# Patient Record
Sex: Male | Born: 1995 | Race: Black or African American | Hispanic: No | Marital: Single | Smoking: Never smoker
Health system: Southern US, Community
[De-identification: ages and names within clinical notes are randomized; demographics above are authoritative.]

## PROBLEM LIST (undated history)

## (undated) DIAGNOSIS — F909 Attention-deficit hyperactivity disorder, unspecified type: Secondary | ICD-10-CM

## (undated) DIAGNOSIS — T7840XA Allergy, unspecified, initial encounter: Secondary | ICD-10-CM

## (undated) HISTORY — DX: Attention-deficit hyperactivity disorder, unspecified type: F90.9

## (undated) HISTORY — DX: Allergy, unspecified, initial encounter: T78.40XA

---
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
## 2017-03-11 DIAGNOSIS — B360 Pityriasis versicolor: Secondary | ICD-10-CM | POA: Diagnosis not present

## 2017-03-11 DIAGNOSIS — Z0000 Encounter for general adult medical examination without abnormal findings: Secondary | ICD-10-CM | POA: Diagnosis not present

## 2017-05-14 DIAGNOSIS — J209 Acute bronchitis, unspecified: Secondary | ICD-10-CM | POA: Diagnosis not present

## 2017-09-10 ENCOUNTER — Other Ambulatory Visit: Payer: Self-pay

## 2017-09-10 ENCOUNTER — Emergency Department (HOSPITAL_COMMUNITY)
Admission: EM | Admit: 2017-09-10 | Discharge: 2017-09-11 | Disposition: A | Discharge status: Home / Self Care | Payer: Commercial Managed Care - PPO | Attending: Emergency Medicine | Admitting: Emergency Medicine

## 2017-09-10 ENCOUNTER — Encounter (HOSPITAL_COMMUNITY): Payer: Self-pay | Admitting: Obstetrics and Gynecology

## 2017-09-10 DIAGNOSIS — F22 Delusional disorders: Secondary | ICD-10-CM

## 2017-09-10 DIAGNOSIS — J45909 Unspecified asthma, uncomplicated: Secondary | ICD-10-CM | POA: Diagnosis not present

## 2017-09-10 DIAGNOSIS — F19959 Other psychoactive substance use, unspecified with psychoactive substance-induced psychotic disorder, unspecified: Secondary | ICD-10-CM | POA: Diagnosis present

## 2017-09-10 DIAGNOSIS — F16950 Hallucinogen use, unspecified with hallucinogen-induced psychotic disorder with delusions: Secondary | ICD-10-CM | POA: Insufficient documentation

## 2017-09-10 DIAGNOSIS — Z79899 Other long term (current) drug therapy: Secondary | ICD-10-CM | POA: Diagnosis not present

## 2017-09-10 DIAGNOSIS — Z046 Encounter for general psychiatric examination, requested by authority: Secondary | ICD-10-CM | POA: Diagnosis not present

## 2017-09-10 DIAGNOSIS — R440 Auditory hallucinations: Secondary | ICD-10-CM | POA: Diagnosis present

## 2017-09-10 LAB — CBC
HCT: 43.1 % (ref 39.0–52.0)
Hemoglobin: 15 g/dL (ref 13.0–17.0)
MCH: 29.4 pg (ref 26.0–34.0)
MCHC: 34.8 g/dL (ref 30.0–36.0)
MCV: 84.5 fL (ref 78.0–100.0)
PLATELETS: 194 10*3/uL (ref 150–400)
RBC: 5.1 MIL/uL (ref 4.22–5.81)
RDW: 11.5 % (ref 11.5–15.5)
WBC: 3.6 10*3/uL — ABNORMAL LOW (ref 4.0–10.5)

## 2017-09-10 LAB — COMPREHENSIVE METABOLIC PANEL WITH GFR
ALT: 21 U/L (ref 0–44)
AST: 29 U/L (ref 15–41)
Albumin: 5.1 g/dL — ABNORMAL HIGH (ref 3.5–5.0)
Alkaline Phosphatase: 62 U/L (ref 38–126)
Anion gap: 15 (ref 5–15)
BUN: 18 mg/dL (ref 6–20)
CO2: 19 mmol/L — ABNORMAL LOW (ref 22–32)
Calcium: 10.3 mg/dL (ref 8.9–10.3)
Chloride: 105 mmol/L (ref 98–111)
Creatinine, Ser: 1.02 mg/dL (ref 0.61–1.24)
GFR calc Af Amer: >60 mL/min
GFR calc non Af Amer: >60 mL/min
Glucose, Bld: 91 mg/dL (ref 70–99)
Potassium: 4.1 mmol/L (ref 3.5–5.1)
Sodium: 139 mmol/L (ref 135–145)
Total Bilirubin: 1.8 mg/dL — ABNORMAL HIGH (ref 0.3–1.2)
Total Protein: 9.1 g/dL — ABNORMAL HIGH (ref 6.5–8.1)

## 2017-09-10 LAB — RAPID URINE DRUG SCREEN, HOSP PERFORMED
Amphetamines: NOT DETECTED
Barbiturates: NOT DETECTED
Benzodiazepines: NOT DETECTED
Cocaine: NOT DETECTED
Opiates: NOT DETECTED
Tetrahydrocannabinol: NOT DETECTED

## 2017-09-10 LAB — ACETAMINOPHEN LEVEL

## 2017-09-10 LAB — SALICYLATE LEVEL: Salicylate Lvl: <7 mg/dL (ref 2.8–30.0)

## 2017-09-10 LAB — ETHANOL: Alcohol, Ethyl (B): <10 mg/dL

## 2017-09-10 MED ORDER — LORAZEPAM 1 MG PO TABS
1.0000 mg | ORAL_TABLET | Freq: Once | ORAL | Status: DC
  Filled 2017-09-10: qty 1

## 2017-09-10 MED ORDER — LORAZEPAM 1 MG PO TABS: 1.0000 mg | ORAL_TABLET | ORAL | Status: DC | PRN

## 2017-09-10 NOTE — ED Triage Notes (Signed)
Pt is here with [facility] for a psych evaluation. Pt has been acting weird and staring at the walls. Pt reports he "might have been given some PCP" and reports he "read the bible and got too deep into it"

## 2017-09-10 NOTE — ED Notes (Addendum)
Patient wanded by security and belongings placed at nurses station
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
## 2017-09-10 NOTE — ED Notes (Signed)
TTS at bedside. 

## 2017-09-10 NOTE — ED Notes (Signed)
Bed: [facility]36 Expected date:  Expected time:  Means of arrival:  Comments: Hold for triage 4 

## 2017-09-10 NOTE — ED Notes (Signed)
This nurse continues to encourage pt to provide urine per MD order. Paranoia noted.

## 2017-09-10 NOTE — ED Notes (Signed)
Bed: [facility] Expected date:  Expected time:  Means of arrival:  Comments: 

## 2017-09-10 NOTE — ED Triage Notes (Addendum)
Per [employer]- Patient walked to a church and told a staff member that he needed mental help.  patient denies SI/HI, but does say he hears voices and he is on a "spiritual warfare.". Patient states he did drugs today, but does not know what he took. Patient drinking any alcohol.  [facility] notified the parents [employer] added that the patient went missing in July and was found. Today, the parents reported to [employer] that they had dropped him off at the gym today and when they went to pick him up he was missing  Again.

## 2017-09-10 NOTE — ED Provider Notes (Signed)
[facility]-EMERGENCY DEPT Provider Note   CSN: [id number] Arrival date & time: 09/10/17  1425     History   Chief Complaint Chief Complaint  Patient presents with  . Psychiatric Evaluation    HPI Cameron May is a 22 y.o. male.  Cameron May is a 22 y.o. Male history of asthma, allergies and ADHD, who presents to the ED via [employer] after patient walked into a church and told a staff member he needed mental help.  Patient states he is on an "spiritual warfare".  He hears voices that are guiding him to the path of righteousness, and he needs to rebuke all of the evil and the voices of helping him.  Patient is not able to elaborate on the specific things the voices are telling him to do.  He reports he is not currently having thoughts of harming himself or others but has had these thoughts in the past.  He reports using PCP and marijuana today but denies other drug use.  According to [employer] they notified parents who report that they dropped him off at a gym today when they went back to pick him up he was missing, they also report patient went missing in July but was found.  Parents have not yet arrived to speak with them directly.  Patient denies any medical problems, denies any focal medical complaints, no headaches, vision changes, nausea, vomiting, abdominal pain, chest pain or shortness of breath.   The history is provided by the patient and the police.    Past Medical History:  Diagnosis Date  . ADHD (attention deficit hyperactivity disorder)    No current meds  . Allergy   . Asthma    no recent Sx, has Albuterol MDI    Patient Active Problem List   Diagnosis Date Noted  . Hemorrhoid 04/21/2012  . Allergic rhinitis 04/30/2011  . ADHD (attention deficit hyperactivity disorder) 04/30/2011    History reviewed. No pertinent surgical history.      Home Medications    Prior to Admission medications   Medication Sig Start Date End Date  Taking? Authorizing Provider  albuterol (PROVENTIL HFA;VENTOLIN HFA) 108 (90 BASE) MCG/ACT inhaler Inhale 2 puffs into the lungs every 4 (four) hours as needed for wheezing or shortness of breath. 10/18/11   Preston Fleeting, MD  cetirizine (ZYRTEC) 10 MG tablet Take 10 mg by mouth daily.    [provider]  methylphenidate (CONCERTA) 27 MG CR tablet Take 1 tablet (27 mg total) by mouth daily with breakfast. 06/01/11   Lucio Edward, MD    Family History Family History  Problem Relation Age of Onset  . Cancer Paternal Grandfather 89       pacreatic cancer  . Diabetes Paternal Grandfather   . Mental retardation Sister   . Birth defects Sister        genetic abnormalities  . Hypertension Mother   . Hypertension Father 20       on medication  . Hypertension Paternal Uncle   . Hypertension Maternal Grandmother   . Hypertension Maternal Grandfather   . Kidney disease Paternal Grandmother        due hypertenstion  . Hypertension Paternal Grandmother     Social History Social History   Tobacco Use  . Smoking status: Never Smoker  . Smokeless tobacco: Never Used  Substance Use Topics  . Alcohol use: No  . Drug use: Yes    Comment: patiaent does not elaborate  Allergies   Apple   Review of Systems Review of Systems  Constitutional: Negative for chills and fever.  HENT: Negative.   Eyes: Negative for visual disturbance.  Respiratory: Negative for cough and shortness of breath.   Cardiovascular: Negative for chest pain.  Gastrointestinal: Negative for abdominal pain, nausea and vomiting.  Genitourinary: Negative for dysuria and frequency.  Musculoskeletal: Negative for arthralgias and myalgias.  Skin: Negative for color change, rash and wound.  Neurological: Negative for dizziness, syncope, light-headedness and headaches.  Psychiatric/Behavioral: Positive for hallucinations and sleep disturbance. Negative for suicidal ideas.     Physical Exam Updated Vital  Signs BP 135/75 (BP Location: Left Arm)   Pulse 76   Temp 98 F (36.7 C) (Oral)   Resp 14   Ht 5\' 11"  (1.803 m)   Wt 83.9 kg   SpO2 100%   BMI 25.80 kg/m   Physical Exam  Constitutional: He is oriented to person, place, and time. He appears well-developed and well-nourished. No distress.  HENT:  Head: Normocephalic and atraumatic.  Mouth/Throat: Oropharynx is clear and moist.  Eyes: Pupils are equal, round, and reactive to light. EOM are normal. Right eye exhibits no discharge. Left eye exhibits no discharge.  Neck: Neck supple.  Cardiovascular: Normal rate, regular rhythm, normal heart sounds and intact distal pulses.  Pulmonary/Chest: Effort normal and breath sounds normal. No respiratory distress.  Respirations equal and unlabored, patient able to speak in full sentences, lungs clear to auscultation bilaterally  Abdominal: Soft. Bowel sounds are normal. He exhibits no distension and no mass. There is no tenderness. There is no guarding.  Abdomen soft, nondistended, nontender to palpation in all quadrants without guarding or peritoneal signs  Musculoskeletal: He exhibits no edema or deformity.  Neurological: He is alert and oriented to person, place, and time. Coordination normal.  Skin: Skin is warm and dry. Capillary refill takes less than 2 seconds. He is not diaphoretic.  Psychiatric: His affect is blunt. His speech is delayed. He is slowed and withdrawn. He is not actively hallucinating. Thought content is delusional. Cognition and memory are normal. He expresses impulsivity. He expresses no homicidal and no suicidal ideation. He expresses no suicidal plans and no homicidal plans.  Nursing note and vitals reviewed.    ED Treatments / Results  Labs (all labs ordered are listed, but only abnormal results are displayed) Labs Reviewed  COMPREHENSIVE METABOLIC PANEL - Abnormal; Notable for the following components:      Result Value   CO2 19 (*)    Total Protein 9.1 (*)     Albumin 5.1 (*)    Total Bilirubin 1.8 (*)    All other components within normal limits  CBC - Abnormal; Notable for the following components:   WBC 3.6 (*)    All other components within normal limits  ACETAMINOPHEN LEVEL - Abnormal; Notable for the following components:   Acetaminophen (Tylenol), Serum <10 (*)    All other components within normal limits  ETHANOL  RAPID URINE DRUG SCREEN, HOSP PERFORMED  SALICYLATE LEVEL    EKG None  Radiology No results found.  Procedures Procedures (including critical care time)  Medications Ordered in ED Medications - No data to display   Initial Impression / Assessment and Plan / ED Course  I have reviewed the triage vital signs and the nursing notes.  Pertinent labs & imaging results that were available during my care of the patient were reviewed by me and considered in my medical decision making (see  chart for details).  Presents via [employer] requesting mental help, reports he has voices that are trying to leave him on the past 2 right business, and to help him get away from all the evil.  Patient does not appear to be actively responding to internal stimuli but does appear paranoid and delusional on exam.  Mom reports patient has not been sleeping.  He denies SI, or HI.  Vitals normal and patient without focal medical complaints.  No findings on exam.  Medical screening labs and EKG ordered.  Patient is calm and cooperative at this time continues to talk about voices leading him to a path of right dizziness.  Mom reports that patient went missing with a similar episode in July but was found, and after she dropped him off at a gym today when she went to pick him up he was nowhere to be found.  Does endorse use of PCP prior to arrival.  White count of 3.6 patient is not having any current infectious symptoms, normal hemoglobin, no acute electrolyte derangements, normal renal and liver function.  Negative ethanol, salicylate and acetaminophen  levels.  UDS is clear.  EKG without concerning changes.   At this time patient is medically cleared pending TTS evaluation, patient placed under ED psych hold.  TTS recommends overnight observation for continued patient safety, with plan reevaluation with psychiatry in the morning.  Final Clinical Impressions(s) / ED Diagnoses   Final diagnoses:  Delusions [facility])  Paranoid behavior [facility])    ED Discharge Orders    None       Dartha Lodge, [place] 09/11/17 0118    Clarene Duke Ambrose Finland, MD 09/17/17 [phone]

## 2017-09-10 NOTE — ED Notes (Signed)
Pt mother at bedside visiting with pt.  

## 2017-09-10 NOTE — ED Notes (Signed)
Patient denies pain and is resting comfortably.  

## 2017-09-10 NOTE — ED Notes (Signed)
Pt requests that he have no visitors except his mother and father. No other family is to be permitted to visit, per pt.

## 2017-09-10 NOTE — ED Notes (Signed)
Pt to room #36. Pt religiously preoccupied, deluisonal. "I've been living in my head." Denies SI/HI. Pt endorsing AH. Encouragement and support provided. Specimen cup provided and pt encouraged to provide urine per MD order. Special checks q 15 mins in place for safety, Video monitoring in place. Will continue to monitor.

## 2017-09-11 DIAGNOSIS — J45909 Unspecified asthma, uncomplicated: Secondary | ICD-10-CM | POA: Diagnosis not present

## 2017-09-11 DIAGNOSIS — F19959 Other psychoactive substance use, unspecified with psychoactive substance-induced psychotic disorder, unspecified: Secondary | ICD-10-CM

## 2017-09-11 MED ORDER — LORAZEPAM 2 MG/ML IJ SOLN
1.0000 mg | Freq: Four times a day (QID) | INTRAMUSCULAR | Status: DC | PRN
  Administered 2017-09-11: 1 mg via INTRAMUSCULAR
  Filled 2017-09-11: qty 1

## 2017-09-11 NOTE — Discharge Instructions (Signed)
For your behavioral health needs, you are advised to follow up with one of providers.  All offer both psychiatry/medication management, as well as therapy.  Contact them at your earliest opportunity to ask about scheduling an intake appointment:       [facility]      [address]      [place], [place] [address]      [phone]       [facility]      [address]      [place], [place] [address]      [phone]       [facility]      [phone]      [place], [place] [address]      [phone]

## 2017-09-11 NOTE — Consult Note (Signed)
[facility] ED Discharge  09/11/2017 12:52 PM Cameron May  MRN:  [id number] Principal Problem: Substance-induced psychotic disorder [facility]) Discharge Diagnoses:  Patient Active Problem List   Diagnosis Date Noted  . Hemorrhoid [K64.9] 04/21/2012  . Allergic rhinitis [477] 04/30/2011  . ADHD (attention deficit hyperactivity disorder) [F90.9] 04/30/2011    Subjective:  Mr. Cameron May denies SI, HI or AVH. He reports recently using PCP and reports AVH while intoxicated. He has been experimenting with multiple drugs including Xanax, LSD and Percocet. He regularly uses marijuana. He reports self-medicating with drugs due to depression. He lives at home with his parents and sister and feels safe for discharge.   Total Time spent with patient: 30 minutes  Past Psychiatric History: Depression  Past Medical History:  Past Medical History:  Diagnosis Date  . ADHD (attention deficit hyperactivity disorder)    No current meds  . Allergy   . Asthma    no recent Sx, has Albuterol MDI   History reviewed. No pertinent surgical history. Family History:  Family History  Problem Relation Age of Onset  . Cancer Paternal Grandfather 27       pacreatic cancer  . Diabetes Paternal Grandfather   . Mental retardation Sister   . Birth defects Sister        genetic abnormalities  . Hypertension Mother   . Hypertension Father 73       on medication  . Hypertension Paternal Uncle   . Hypertension Maternal Grandmother   . Hypertension Maternal Grandfather   . Kidney disease Paternal Grandmother        due hypertenstion  . Hypertension Paternal Grandmother    Family Psychiatric  History: Sister-IDD Social History:  Social History   Substance and Sexual Activity  Alcohol Use No    Social History   Substance and Sexual Activity  Drug Use Yes   Comment: patiaent does not elaborate   Social History   Socioeconomic History  . Marital status: Single    Spouse name: Not on file  . Number  of children: Not on file  . Years of education: Not on file  . Highest education level: Not on file  Occupational History  . Not on file  Social Needs  . Financial resource strain: Not on file  . Food insecurity:    Worry: Not on file    Inability: Not on file  . Transportation needs:    Medical: Not on file    Non-medical: Not on file  Tobacco Use  . Smoking status: Never Smoker  . Smokeless tobacco: Never Used  Substance and Sexual Activity  . Alcohol use: No  . Drug use: Yes    Comment: patiaent does not elaborate  . Sexual activity: Never  Lifestyle  . Physical activity:    Days per week: Not on file    Minutes per session: Not on file  . Stress: Not on file  Relationships  . Social connections:    Talks on phone: Not on file    Gets together: Not on file    Attends religious service: Not on file    Active member of club or organization: Not on file    Attends meetings of clubs or organizations: Not on file    Relationship status: Not on file  Other Topics Concern  . Not on file  Social History Narrative   [employer]   9 th grade.    Has this patient used any form of tobacco  in the last 30 days? (Cigarettes, Smokeless Tobacco, Cigars, and/or Pipes) Prescription not provided because: N/A  Current Medications: Current Facility-Administered Medications  Medication Dose Route Frequency Provider Last Rate Last Dose  . LORazepam (ATIVAN) injection 1 mg  1 mg Intramuscular Q6H PRN Dartha LodgeFord, Kelsey N, PA-C   1 mg at 09/11/17 0023   No current outpatient medications on file.   PTA Medications:  (Not in a hospital admission)  Musculoskeletal: Strength & Muscle Tone: within normal limits Gait & Station: UTA since patient is lying in bed. Patient leans: N/A  Psychiatric Specialty Exam: Physical Exam  Nursing note and vitals reviewed. Constitutional: He is oriented to person, place, and time. He appears well-developed and well-nourished.  HENT:   Head: Normocephalic and atraumatic.  Neck: Normal range of motion.  Respiratory: Effort normal.  Musculoskeletal: Normal range of motion.  Neurological: He is alert and oriented to person, place, and time.  Skin: No rash noted.  Psychiatric: He has a normal mood and affect. His speech is normal and behavior is normal. Judgment and thought content normal. Cognition and memory are normal.    Review of Systems  Psychiatric/Behavioral: Positive for depression and substance abuse. Negative for hallucinations and suicidal ideas.  All other systems reviewed and are negative.   Blood pressure 118/66, pulse 95, temperature 98.5 F (36.9 C), temperature source Oral, resp. rate 16, height 5\' 11"  (1.803 m), weight 83.9 kg, SpO2 95 %.Body mass index is 25.8 kg/m.  General Appearance: Fairly Groomed, young, African American male, wearing paper hospital scrubs and lying in bed. NAD.   Eye Contact:  Good  Speech:  Clear and Coherent and Normal Rate  Volume:  Normal  Mood:  Depressed  Affect:  Constricted  Thought Process:  Goal Directed, Linear and Descriptions of Associations: Intact  Orientation:  Full (Time, Place, and Person)  Thought Content:  Logical  Suicidal Thoughts:  No  Homicidal Thoughts:  No  Memory:  Immediate;   Good Recent;   Good Remote;   Good  Judgement:  Fair  Insight:  Fair  Psychomotor Activity:  Normal  Concentration:  Concentration: Good and Attention Span: Good  Recall:  Good  Fund of Knowledge:  Good  Language:  Good  Akathisia:  No  Handed:  Right  AIMS (if indicated):   N/A  Assets:  Communication Skills Desire for Improvement Housing Physical Health Social Support  ADL's:  Intact  Cognition:  WNL  Sleep:   N/A   Assessment:  Cameron May is a 22 y.o. male presented to the hospital with psychosis in the setting of PCP use. He is clear and well organized in thought process today. He minimizes his substance use although he was counseled about  cessation. He denies SI, HI or AVH and does not appear to be responding to internal stimuli. He does not warrant inpatient psychiatric hospitalization at this time.   Demographic Factors:  Male and Adolescent or young adult  Loss Factors: NA  Historical Factors: Impulsivity  Risk Reduction Factors:   Positive social support  Continued Clinical Symptoms:  Depression:   Impulsivity  Cognitive Features That Contribute To Risk:  None    Suicide Risk:  Minimal: No identifiable suicidal ideation.  Patients presenting with no risk factors but with morbid ruminations; may be classified as minimal risk based on the severity of the depressive symptoms    Plan Of Care/Follow-up recommendations:  -Patient will be provided with outpatient resources.  -Educated about substance abuse cessation.  Disposition: Discharge home.  Cherly Beach, DO 09/11/2017, 12:52 PM

## 2017-09-11 NOTE — ED Notes (Addendum)
Dr Sharma CovertNorman and Feliz Beamravis NP into see.  Pt sleeping, arousable.  Pt reports that he did PCP yesterday and denies current si/hi/avh at this time.  Pt reports that he does not do drugs a lot, but does use them when he gets depressed.  Pt reports that he lives with his family.  Pt reports he is willing to go to OP therapy and is also interested in councling at his church.

## 2017-09-11 NOTE — BH Assessment (Signed)
[facility]  Per Juanetta BeetsJacqueline Norman, DO, this pt does not require psychiatric hospitalization at this time.  [facility] information for area psychiatrists and therapists for follow up on his own initiative.  This has been included in pt's discharge instructions.  Pt's nurse, Wille CelesteJanie, has been notified.  Doylene Canninghomas Emrys Mckamie, MA Triage Specialist [phone]

## 2017-09-11 NOTE — ED Notes (Signed)
Did not awaken for vitals  

## 2017-09-11 NOTE — ED Notes (Signed)
Pt's mother called and she will pick him up

## 2017-09-11 NOTE — ED Notes (Addendum)
Administered meds Ativan IM Rt. Deltoid @ 0023. Pt slept about 5 hours, Sleeping hours 0145- continues to sleep.

## 2017-09-11 NOTE — ED Notes (Signed)
Written dc instructions reviewed with pt.  Pt encouraged to follow up with OP referrals as soon as possible, pt verbalized understanding.  Pt's mother has brought clothes for him to change into.  Pt ambulatory with out difficulty to dc area, belongings returned after leaving the area.  Pt's parents are here to pick him up.

## 2017-11-20 ENCOUNTER — Ambulatory Visit (HOSPITAL_COMMUNITY)
Admission: RE | Admit: 2017-11-20 | Discharge: 2017-11-20 | Disposition: A | Discharge status: Home / Self Care | Payer: Commercial Managed Care - PPO | Source: Home / Self Care | Attending: Psychiatry | Admitting: Psychiatry

## 2017-11-20 DIAGNOSIS — Z91018 Allergy to other foods: Secondary | ICD-10-CM

## 2017-11-20 DIAGNOSIS — R443 Hallucinations, unspecified: Secondary | ICD-10-CM | POA: Diagnosis not present

## 2017-11-20 DIAGNOSIS — D72819 Decreased white blood cell count, unspecified: Secondary | ICD-10-CM | POA: Insufficient documentation

## 2017-11-20 DIAGNOSIS — J45909 Unspecified asthma, uncomplicated: Secondary | ICD-10-CM | POA: Insufficient documentation

## 2017-11-20 DIAGNOSIS — Z8249 Family history of ischemic heart disease and other diseases of the circulatory system: Secondary | ICD-10-CM | POA: Insufficient documentation

## 2017-11-20 DIAGNOSIS — F28 Other psychotic disorder not due to a substance or known physiological condition: Secondary | ICD-10-CM | POA: Diagnosis not present

## 2017-11-20 DIAGNOSIS — F909 Attention-deficit hyperactivity disorder, unspecified type: Secondary | ICD-10-CM | POA: Insufficient documentation

## 2017-11-20 DIAGNOSIS — F3181 Bipolar II disorder: Secondary | ICD-10-CM

## 2017-11-20 DIAGNOSIS — F99 Mental disorder, not otherwise specified: Secondary | ICD-10-CM | POA: Diagnosis present
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
## 2017-11-21 ENCOUNTER — Encounter (HOSPITAL_COMMUNITY): Payer: Self-pay | Admitting: *Deleted

## 2017-11-21 ENCOUNTER — Emergency Department (HOSPITAL_COMMUNITY)
Admission: EM | Admit: 2017-11-21 | Discharge: 2017-11-21 | Disposition: A | Discharge status: Other Acute Inpatient Hospital | Payer: Commercial Managed Care - PPO | Attending: Emergency Medicine | Admitting: Emergency Medicine

## 2017-11-21 ENCOUNTER — Inpatient Hospital Stay (HOSPITAL_COMMUNITY)
Admission: AD | Admit: 2017-11-21 | Discharge: 2017-11-25 | DRG: 885 | Disposition: A | Discharge status: Home / Self Care | Payer: Commercial Managed Care - PPO | Source: Intra-hospital | Attending: Psychiatry | Admitting: Psychiatry

## 2017-11-21 ENCOUNTER — Other Ambulatory Visit: Payer: Self-pay

## 2017-11-21 DIAGNOSIS — F1190 Opioid use, unspecified, uncomplicated: Secondary | ICD-10-CM | POA: Diagnosis present

## 2017-11-21 DIAGNOSIS — F29 Unspecified psychosis not due to a substance or known physiological condition: Secondary | ICD-10-CM | POA: Diagnosis present

## 2017-11-21 DIAGNOSIS — Z7289 Other problems related to lifestyle: Secondary | ICD-10-CM | POA: Diagnosis not present

## 2017-11-21 DIAGNOSIS — F1290 Cannabis use, unspecified, uncomplicated: Secondary | ICD-10-CM | POA: Diagnosis present

## 2017-11-21 DIAGNOSIS — F909 Attention-deficit hyperactivity disorder, unspecified type: Secondary | ICD-10-CM | POA: Diagnosis present

## 2017-11-21 DIAGNOSIS — J309 Allergic rhinitis, unspecified: Secondary | ICD-10-CM | POA: Diagnosis present

## 2017-11-21 DIAGNOSIS — F23 Brief psychotic disorder: Principal | ICD-10-CM | POA: Diagnosis present

## 2017-11-21 DIAGNOSIS — R45850 Homicidal ideations: Secondary | ICD-10-CM | POA: Diagnosis present

## 2017-11-21 DIAGNOSIS — Z62810 Personal history of physical and sexual abuse in childhood: Secondary | ICD-10-CM | POA: Diagnosis present

## 2017-11-21 DIAGNOSIS — D72819 Decreased white blood cell count, unspecified: Secondary | ICD-10-CM

## 2017-11-21 DIAGNOSIS — Z91018 Allergy to other foods: Secondary | ICD-10-CM | POA: Diagnosis not present

## 2017-11-21 DIAGNOSIS — J45909 Unspecified asthma, uncomplicated: Secondary | ICD-10-CM | POA: Diagnosis not present

## 2017-11-21 DIAGNOSIS — F323 Major depressive disorder, single episode, severe with psychotic features: Secondary | ICD-10-CM | POA: Diagnosis present

## 2017-11-21 DIAGNOSIS — R45851 Suicidal ideations: Secondary | ICD-10-CM | POA: Diagnosis present

## 2017-11-21 DIAGNOSIS — F3181 Bipolar II disorder: Secondary | ICD-10-CM

## 2017-11-21 DIAGNOSIS — Z915 Personal history of self-harm: Secondary | ICD-10-CM | POA: Diagnosis not present

## 2017-11-21 DIAGNOSIS — Z810 Family history of intellectual disabilities: Secondary | ICD-10-CM

## 2017-11-21 DIAGNOSIS — F419 Anxiety disorder, unspecified: Secondary | ICD-10-CM | POA: Diagnosis not present

## 2017-11-21 DIAGNOSIS — Z8249 Family history of ischemic heart disease and other diseases of the circulatory system: Secondary | ICD-10-CM | POA: Diagnosis not present

## 2017-11-21 DIAGNOSIS — G4700 Insomnia, unspecified: Secondary | ICD-10-CM | POA: Diagnosis not present

## 2017-11-21 DIAGNOSIS — F28 Other psychotic disorder not due to a substance or known physiological condition: Secondary | ICD-10-CM | POA: Diagnosis not present

## 2017-11-21 DIAGNOSIS — F19959 Other psychoactive substance use, unspecified with psychoactive substance-induced psychotic disorder, unspecified: Secondary | ICD-10-CM | POA: Diagnosis present

## 2017-11-21 LAB — CBC
HCT: 42.2 % (ref 39.0–52.0)
Hemoglobin: 14.3 g/dL (ref 13.0–17.0)
MCH: 29.9 pg (ref 26.0–34.0)
MCHC: 33.9 g/dL (ref 30.0–36.0)
MCV: 88.1 fL (ref 80.0–100.0)
Platelets: 190 10*3/uL (ref 150–400)
RBC: 4.79 MIL/uL (ref 4.22–5.81)
RDW: 11.4 % — AB (ref 11.5–15.5)
WBC: 2.6 10*3/uL — ABNORMAL LOW (ref 4.0–10.5)
nRBC: 0 % (ref 0.0–0.2)

## 2017-11-21 LAB — COMPREHENSIVE METABOLIC PANEL
ALBUMIN: 4.7 g/dL (ref 3.5–5.0)
ALT: 18 U/L (ref 0–44)
AST: 23 U/L (ref 15–41)
Alkaline Phosphatase: 58 U/L (ref 38–126)
Anion gap: 9 (ref 5–15)
BILIRUBIN TOTAL: 1.3 mg/dL — AB (ref 0.3–1.2)
BUN: 15 mg/dL (ref 6–20)
CHLORIDE: 104 mmol/L (ref 98–111)
CO2: 26 mmol/L (ref 22–32)
Calcium: 9.6 mg/dL (ref 8.9–10.3)
Creatinine, Ser: 0.98 mg/dL (ref 0.61–1.24)
GFR calc Af Amer: >60 mL/min (ref >60)
GFR calc non Af Amer: >60 mL/min (ref >60)
GLUCOSE: 89 mg/dL (ref 70–99)
POTASSIUM: 3.7 mmol/L (ref 3.5–5.1)
SODIUM: 139 mmol/L (ref 135–145)
Total Protein: 8.5 g/dL — ABNORMAL HIGH (ref 6.5–8.1)

## 2017-11-21 LAB — RAPID URINE DRUG SCREEN, HOSP PERFORMED
Amphetamines: NOT DETECTED
Barbiturates: NOT DETECTED
Benzodiazepines: NOT DETECTED
Cocaine: NOT DETECTED
Opiates: NOT DETECTED
Tetrahydrocannabinol: NOT DETECTED

## 2017-11-21 LAB — ETHANOL: Alcohol, Ethyl (B): <10 mg/dL (ref <10)

## 2017-11-21 MED ORDER — MAGNESIUM HYDROXIDE 400 MG/5ML PO SUSP: 30.0000 mL | Freq: Every day | ORAL | Status: DC | PRN

## 2017-11-21 MED ORDER — HYDROXYZINE HCL 25 MG PO TABS: 25.0000 mg | ORAL_TABLET | Freq: Three times a day (TID) | ORAL | Status: DC | PRN

## 2017-11-21 MED ORDER — ACETAMINOPHEN 325 MG PO TABS: 650.0000 mg | ORAL_TABLET | Freq: Four times a day (QID) | ORAL | Status: DC | PRN

## 2017-11-21 MED ORDER — TRAZODONE HCL 50 MG PO TABS: 50.0000 mg | ORAL_TABLET | Freq: Every day | ORAL | Status: DC

## 2017-11-21 MED ORDER — ALUM & MAG HYDROXIDE-SIMETH 200-200-20 MG/5ML PO SUSP: 30.0000 mL | ORAL | Status: DC | PRN

## 2017-11-21 MED ORDER — HYDROXYZINE HCL 25 MG PO TABS
25.0000 mg | ORAL_TABLET | Freq: Three times a day (TID) | ORAL | Status: DC | PRN
  Filled 2017-11-21: qty 1

## 2017-11-21 MED ORDER — TRAZODONE HCL 50 MG PO TABS
50.0000 mg | ORAL_TABLET | Freq: Every day | ORAL | Status: DC
  Filled 2017-11-21 (×7): qty 1

## 2017-11-21 NOTE — ED Notes (Signed)
Pt in bed resting with eyes closed.

## 2017-11-21 NOTE — ED Notes (Signed)
Bed: ION62 Expected date:  Expected time:  Means of arrival:  Comments: Hold for triage bed 4

## 2017-11-21 NOTE — ED Notes (Signed)
Pt alert and oriented, pt denies any pain or discomfort at this time. Pt denies si,hi,and avh. Pt resting quietly in bed, will continue to monitor.

## 2017-11-21 NOTE — ED Notes (Signed)
Pt cannot give urine specimen at this time.

## 2017-11-21 NOTE — H&P (Signed)
Behavioral Health Medical Screening Exam  Cameron May is an 22 y.o. male presented to [facility] as a walk-in voluntarily, accompanied by his father, Cameron May and a mobile crisis clinician [name] ([facility]). Per report, pt made comments to his mother about getting a gun to kill himself. He is demonstrating marked response to internal stimuli and reported VH  Total Time spent with patient: 20 minutes  Psychiatric Specialty Exam: Physical Exam  Constitutional: He is oriented to person, place, and time. He appears well-developed and well-nourished. No distress.  HENT:  Head: Normocephalic.  Eyes: Pupils are equal, round, and reactive to light.  Respiratory: Effort normal and breath sounds normal.  Neurological: He is alert and oriented to person, place, and time. No cranial nerve deficit.  Skin: Skin is warm and dry. He is not diaphoretic.  Psychiatric: His mood appears anxious. His speech is delayed. He is withdrawn and actively hallucinating. Cognition and memory are impaired. He expresses inappropriate judgment. He exhibits a depressed mood. He expresses suicidal ideation. He expresses suicidal plans.    Review of Systems  Constitutional: Negative for chills, diaphoresis, fever, malaise/fatigue and weight loss.  Psychiatric/Behavioral: Positive for depression, hallucinations and suicidal ideas. Negative for memory loss and substance abuse. The patient is nervous/anxious and has insomnia.     There were no vitals taken for this visit.There is no height or weight on file to calculate BMI.  General Appearance: Casual  Eye Contact:  Fair  Speech:  Blocked  Volume:  Normal  Mood:  Depressed  Affect:  Congruent  Thought Process:  Disorganized  Orientation:  Full (Time, Place, and Person)  Thought Content:  Illogical  Suicidal Thoughts:  Yes.  with intent/plan  Homicidal Thoughts:  No  Memory:  Immediate;   Poor  Judgement:  Impaired  Insight:  Lacking  Psychomotor  Activity:  Normal  Concentration: Concentration: Poor  Recall:  Poor  Fund of Knowledge:Fair  Language: Fair  Akathisia:  Negative  Handed:  Right  AIMS (if indicated):     Assets:  Desire for Improvement  Sleep:       Musculoskeletal: Strength & Muscle Tone: within normal limits Gait & Station: normal Patient leans: N/A  There were no vitals taken for this visit.  Recommendations:  Based on my evaluation the patient does not appear to have an emergency medical condition.  Kerry Hough, PA-C 11/21/2017, 5:51 AM

## 2017-11-21 NOTE — BH Assessment (Addendum)
[facility] Assessment Progress Note  Per Juanetta Beets, DO, this pt requires psychiatric hospitalization at this time.  Berneice Heinrich, RN, [facility] has assigned pt to [facility] Rm [id number]; [facility] will be ready to receive pt at 20:00.  Pt has signed Voluntary Admission and Consent for Treatment, as well as Consent to Release Information to pt's mother, and signed forms have been faxed to [facility].  Pt's nurse, Diane, has been notified, and agrees to send original paperwork along with pt via Juel Burrow, and to call report to [phone].  Doylene Canning, [place] Behavioral Health Coordinator [phone]   Addendum:  Per Berneice Heinrich, [facility] is now ready to receive pt at 17:00.  Diane has been notified.  Doylene Canning, [place] Behavioral Health Coordinator [phone]

## 2017-11-21 NOTE — Progress Notes (Signed)
D: Pt denies SI/HI/AVH- "not anymore". Pt is pleasant and cooperative. Pt visible on the unit, pt presents very childlike with his mindset about life and situations.  A: Pt was offered support and encouragement.  Pt was encourage to attend groups. Q 15 minute checks were done for safety.  R:Pt attends groups and interacts well with peers and staff.  Pt has no complaints.Pt receptive to treatment and safety maintained on unit.  Problem: Activity: Goal: Interest or engagement in activities will improve Outcome: Progressing   Problem: Activity: Goal: Sleeping patterns will improve Outcome: Progressing   Problem: Safety: Goal: Periods of time without injury will increase Outcome: Progressing

## 2017-11-21 NOTE — ED Notes (Signed)
Report given to Alyssa at [facility] [place] transportation called for transport.

## 2017-11-21 NOTE — ED Notes (Signed)
Transported to [facility] by [employer]. All belongings returned to pt who signed for same. PT was calm and cooperative.

## 2017-11-21 NOTE — ED Triage Notes (Signed)
Pt reports he is here because he needs to find himself and thinks that he is running out of time.  Sometimes he thinks that he does not need to be here.  He denies hallucinations but states "talking like rapping, feel me?  It's bad and cool at the same time, feel me?"  He denies SI/HI.  Pt mumbles when talking.  He denies any hx.
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
## 2017-11-21 NOTE — ED Notes (Signed)
Pt taken to BR to attempt to provide urine sample, pt stated he was unable to provide sample.  Pt standing in front of mirror in BR and talking to himself. Pt stated, "I need to talk to the man in the mirror, ya feel me".

## 2017-11-21 NOTE — ED Notes (Signed)
Pt appears to be disorganized some as he almost went into another person's room. He is redirectable and calm and cooperative. Used the emergency alarm in his room thinking it is a call bell and said, "I am the king and do not feel like exerting myself."

## 2017-11-21 NOTE — ED Provider Notes (Signed)
[facility]-EMERGENCY DEPT Provider Note   CSN: [id number] Arrival date & time: 11/20/17  2329     History   Chief Complaint Chief Complaint  Patient presents with  . Medical Clearance    HPI Nazier Neyhart is a 22 y.o. male.   22 year old male with a history of ADHD and substance-induced psychotic disorder presents to the emergency department for evaluation.  Patient in triage reports that he "needs to find himself and thinks he is running out of time".  Denies any alcohol or illicit drug use today.  Also denies SI and HI.  Has been exhibiting bizarre behavior and triage intermittently.  Resting comfortably during my assessment with no acute complaints.  Denies any physical pain.  States that he is not presently on any medications.  Further denies being followed by a primary care doctor or psychiatric provider.  The history is provided by the patient. No language interpreter was used.    Past Medical History:  Diagnosis Date  . ADHD (attention deficit hyperactivity disorder)    No current meds  . Allergy   . Asthma    no recent Sx, has Albuterol MDI    Patient Active Problem List   Diagnosis Date Noted  . Substance-induced psychotic disorder (HCC)   . Hemorrhoid 04/21/2012  . Allergic rhinitis 04/30/2011  . ADHD (attention deficit hyperactivity disorder) 04/30/2011    History reviewed. No pertinent surgical history.      Home Medications    Prior to Admission medications   Not on File    Family History Family History  Problem Relation Age of Onset  . Cancer Paternal Grandfather 66       pacreatic cancer  . Diabetes Paternal Grandfather   . Mental retardation Sister   . Birth defects Sister        genetic abnormalities  . Hypertension Mother   . Hypertension Father 48       on medication  . Hypertension Paternal Uncle   . Hypertension Maternal Grandmother   . Hypertension Maternal Grandfather   . Kidney disease Paternal  Grandmother        due hypertenstion  . Hypertension Paternal Grandmother     Social History Social History   Tobacco Use  . Smoking status: Never Smoker  . Smokeless tobacco: Never Used  Substance Use Topics  . Alcohol use: No  . Drug use: Yes    Comment: patiaent does not elaborate     Allergies   Apple   Review of Systems Review of Systems Ten systems reviewed and are negative for acute change, except as noted in the HPI.    Physical Exam Updated Vital Signs BP 117/66 (BP Location: Left Arm)   Pulse (!) 56   Temp 97.6 F (36.4 C) (Oral)   Resp 14   Ht 5\' 10"  (1.778 m)   Wt 72.6 kg   SpO2 100%   BMI 22.96 kg/m   Physical Exam  Constitutional: He is oriented to person, place, and time. He appears well-developed and well-nourished. No distress.  Calm and cooperative, in NAD  HENT:  Head: Normocephalic and atraumatic.  Eyes: Conjunctivae and EOM are normal. No scleral icterus.  Neck: Normal range of motion.  Pulmonary/Chest: Effort normal. No respiratory distress.  Respirations even and unlabored  Musculoskeletal: Normal range of motion.  Neurological: He is alert and oriented to person, place, and time.  Skin: Skin is warm and dry. No rash noted. He is not diaphoretic. No erythema.  No pallor.  Psychiatric: He has a normal mood and affect. His behavior is normal.  Denies SI/HI  Nursing note and vitals reviewed.    ED Treatments / Results  Labs (all labs ordered are listed, but only abnormal results are displayed) Labs Reviewed  COMPREHENSIVE METABOLIC PANEL - Abnormal; Notable for the following components:      Result Value   Total Protein 8.5 (*)    Total Bilirubin 1.3 (*)    All other components within normal limits  CBC - Abnormal; Notable for the following components:   WBC 2.6 (*)    RDW 11.4 (*)    All other components within normal limits  ETHANOL  RAPID URINE DRUG SCREEN, HOSP PERFORMED  PATHOLOGIST SMEAR REVIEW     EKG None  Radiology No results found.  Procedures Procedures (including critical care time)  Medications Ordered in ED Medications - No data to display   Initial Impression / Assessment and Plan / ED Course  I have reviewed the triage vital signs and the nursing notes.  Pertinent labs & imaging results that were available during my care of the patient were reviewed by me and considered in my medical decision making (see chart for details).     22 year old male presents to the emergency department for medical clearance.  Was evaluated earlier tonight at [facility].  Noted to have leukopenia similar to 2 months prior.  Otherwise, medically cleared.  TTS recommending inpatient placement for management of bipolar 2 disorder.  Disposition to be determined by oncoming ED provider.   Final Clinical Impressions(s) / ED Diagnoses   Final diagnoses:  Bipolar II disorder (HCC)  Leukopenia, unspecified type    ED Discharge Orders    None       Antony Madura, PA-C 11/21/17 0449    Paula Libra, MD 11/21/17 [phone]

## 2017-11-21 NOTE — Consult Note (Addendum)
[place] Psychiatry Consult   Reason for Consult:  Bizarre behavior Referring Physician:  EDP Patient Identification: Cameron May MRN:  [id number] Principal Diagnosis: Psychosis [facility] Diagnosis:   Patient Active Problem List   Diagnosis Date Noted  . Substance-induced psychotic disorder ([place]) [F19.959]   . Hemorrhoid [K64.9] 04/21/2012  . Allergic rhinitis [477] 04/30/2011  . ADHD (attention deficit hyperactivity disorder) [F90.9] 04/30/2011    Total Time spent with patient: 30 minutes  Subjective:   Cameron May is a 22 y.o. male patient admitted with bizarre behavior.  HPI:  Pt was seen and chart reviewed with treatment team and Dr Mariea Clonts. Pt denies suicidal/homicidal ideation, endorses auditory/visual hallucinations and does not appears to be responding to internal stimuli. Pt stated he feels like he is in a "test tube" and that "there is a truth out there that he doesn't know." Pt is disorganized  states he hears voices that say different things to him and that the conversations pop up in his mind and ha can see them. Pt stated he does not take any mental health medications but then says he sees a psychiatrist named Santiago Glad but she is just who she is "caring." Pt was last seen in the [facility] in Aug 2019 and at that time stated he experimented with PCP and other illicit drugs that [facility] does not test for. He was found not to be a threat to himself or others and discharged. Pt today stated he last smoked marijuana recently to see if it would help him. Pt's UDS and BAL are negative for substances tested for at this facility. Pt lives with his parents and a sister and stated he works at [employer] and is enrolled in college but is not currently attending classes. Pt is malodorous and it is unclear if he is actually taking care of his hygiene needs at home. Pt would benefit from an inpatient psychiatric admission for medication management and crisis stabilization.   Past  Psychiatric History: As above  Risk to Self:  Yes. He endorses SI.  Risk to Others:  Yes. He reports CAH to harm others.  Prior Inpatient Therapy:  Denies  Prior Outpatient Therapy:   He sees a provider named Santiago Glad (possibly a therapist since he does not receive medications).   Past Medical History:  Past Medical History:  Diagnosis Date  . ADHD (attention deficit hyperactivity disorder)    No current meds  . Allergy   . Asthma    no recent Sx, has Albuterol MDI   History reviewed. No pertinent surgical history. Family History:  Family History  Problem Relation Age of Onset  . Cancer Paternal Grandfather 66       pacreatic cancer  . Diabetes Paternal Grandfather   . Mental retardation Sister   . Birth defects Sister        genetic abnormalities  . Hypertension Mother   . Hypertension Father 5       on medication  . Hypertension Paternal Uncle   . Hypertension Maternal Grandmother   . Hypertension Maternal Grandfather   . Kidney disease Paternal Grandmother        due hypertenstion  . Hypertension Paternal Grandmother    Family Psychiatric  History: Denies   Social History:  Social History   Substance and Sexual Activity  Alcohol Use No     Social History   Substance and Sexual Activity  Drug Use Yes   Comment: patiaent does not elaborate    Social History  Socioeconomic History  . Marital status: Single    Spouse name: Not on file  . Number of children: Not on file  . Years of education: Not on file  . Highest education level: Not on file  Occupational History  . Not on file  Social Needs  . Financial resource strain: Not on file  . Food insecurity:    Worry: Not on file    Inability: Not on file  . Transportation needs:    Medical: Not on file    Non-medical: Not on file  Tobacco Use  . Smoking status: Never Smoker  . Smokeless tobacco: Never Used  Substance and Sexual Activity  . Alcohol use: No  . Drug use: Yes    Comment: patiaent does  not elaborate  . Sexual activity: Never  Lifestyle  . Physical activity:    Days per week: Not on file    Minutes per session: Not on file  . Stress: Not on file  Relationships  . Social connections:    Talks on phone: Not on file    Gets together: Not on file    Attends religious service: Not on file    Active member of club or organization: Not on file    Attends meetings of clubs or organizations: Not on file    Relationship status: Not on file  Other Topics Concern  . Not on file  Social History Narrative   [employer]   9 th grade.   Additional Social History: N/A    Allergies:   Allergies  Allergen Reactions  . Apple Itching    Labs:  Results for orders placed or performed during the hospital encounter of 11/21/17 (from the past 48 hour(s))  Comprehensive metabolic panel     Status: Abnormal   Collection Time: 11/21/17 12:31 AM  Result Value Ref Range   Sodium 139 135 - 145 mmol/L   Potassium 3.7 3.5 - 5.1 mmol/L   Chloride 104 98 - 111 mmol/L   CO2 26 22 - 32 mmol/L   Glucose, Bld 89 70 - 99 mg/dL   BUN 15 6 - 20 mg/dL   Creatinine, Ser 0.98 0.61 - 1.24 mg/dL   Calcium 9.6 8.9 - 10.3 mg/dL   Total Protein 8.5 (H) 6.5 - 8.1 g/dL   Albumin 4.7 3.5 - 5.0 g/dL   AST 23 15 - 41 U/L   ALT 18 0 - 44 U/L   Alkaline Phosphatase 58 38 - 126 U/L   Total Bilirubin 1.3 (H) 0.3 - 1.2 mg/dL   GFR calc non Af Amer >60 >60 mL/min   GFR calc Af Amer >60 >60 mL/min    Comment: (NOTE) The eGFR has been calculated using the CKD EPI equation. This calculation has not been validated in all clinical situations. eGFR's persistently <60 mL/min signify possible Chronic Kidney Disease.    Anion gap 9 5 - 15    Comment: Performed at [facility], [place] [address] [place], [place] [address]  Ethanol     Status: None   Collection Time: 11/21/17 12:31 AM  Result Value Ref Range   Alcohol, Ethyl (B) <10 <10 mg/dL    Comment: (NOTE) Lowest  detectable limit for serum alcohol is 10 mg/dL. For medical purposes only. Performed at [facility], [place] [address] [place], [place] [address]   cbc     Status: Abnormal   Collection Time: 11/21/17 12:31 AM  Result Value Ref Range  WBC 2.6 (L) 4.0 - 10.5 K/uL   RBC 4.79 4.22 - 5.81 MIL/uL   Hemoglobin 14.3 13.0 - 17.0 g/dL   HCT 42.2 39.0 - 52.0 %   MCV 88.1 80.0 - 100.0 fL   MCH 29.9 26.0 - 34.0 pg   MCHC 33.9 30.0 - 36.0 g/dL   RDW 11.4 (L) 11.5 - 15.5 %   Platelets 190 150 - 400 K/uL   nRBC 0.0 0.0 - 0.2 %    Comment: Performed at [facility], [place] [address], [place], [place] [address]  Rapid urine drug screen (hospital performed)     Status: None   Collection Time: 11/21/17  8:08 AM  Result Value Ref Range   Opiates NONE DETECTED NONE DETECTED   Cocaine NONE DETECTED NONE DETECTED   Benzodiazepines NONE DETECTED NONE DETECTED   Amphetamines NONE DETECTED NONE DETECTED   Tetrahydrocannabinol NONE DETECTED NONE DETECTED   Barbiturates NONE DETECTED NONE DETECTED    Comment: (NOTE) DRUG SCREEN FOR MEDICAL PURPOSES ONLY.  IF CONFIRMATION IS NEEDED FOR ANY PURPOSE, NOTIFY LAB WITHIN 5 DAYS. LOWEST DETECTABLE LIMITS FOR URINE DRUG SCREEN Drug Class                     Cutoff (ng/mL) Amphetamine and metabolites    1000 Barbiturate and metabolites    200 Benzodiazepine                 631 Tricyclics and metabolites     300 Opiates and metabolites        300 Cocaine and metabolites        300 THC                            50 Performed at [facility], [place] [address] [place], [place] [address]     No current facility-administered medications for this encounter.    No current outpatient medications on file.    Musculoskeletal: Strength & Muscle Tone: within normal limits Gait & Station: normal Patient leans: N/A  Psychiatric Specialty Exam: Physical Exam  Nursing note and vitals  reviewed. Constitutional: He is oriented to person, place, and time. He appears well-developed and well-nourished.  HENT:  Head: Normocephalic and atraumatic.  Neck: Normal range of motion.  Respiratory: Effort normal.  Musculoskeletal: Normal range of motion.  Neurological: He is alert and oriented to person, place, and time.  Psychiatric: Judgment normal. His mood appears anxious. Thought content is paranoid. He exhibits a depressed mood.    Review of Systems  Psychiatric/Behavioral: Positive for hallucinations.  All other systems reviewed and are negative.   Blood pressure 117/66, pulse (!) 56, temperature 97.6 F (36.4 C), temperature source Oral, resp. rate 14, height _0  (1.778 m), weight 72.6 kg, SpO2 100 %.Body mass index is 22.96 kg/m.  General Appearance: Casual  Eye Contact:  Good  Speech:  Clear and Coherent and Normal Rate  Volume:  Normal  Mood:  Anxious and Depressed  Affect:  Congruent and Depressed  Thought Process:  Coherent, Linear and Descriptions of Associations: Tangential  Orientation:  Full (Time, Place, and Person)  Thought Content:  Illogical  Suicidal Thoughts:  No although he does report CAH to harm self.  Homicidal Thoughts:  No although he does report CAH to harm others.  Memory:  Immediate;   Good Recent;   Good Remote;   Fair  Judgement:  Impaired  Insight:  Shallow  Psychomotor Activity:  Decreased  Concentration:  Concentration: Fair and Attention Span: Fair  Recall:  [employer] of Knowledge:  Fair  Language:  Good  Akathisia:  No  Handed:  Right  AIMS (if indicated):   N/A  Assets:  Agricultural consultant Housing Social Support  ADL's:  Intact  Cognition:  WNL  Sleep:   N/A     Treatment Plan Summary: Daily contact with patient to assess and evaluate symptoms and progress in treatment and Medication management (see MAR)  Disposition: Recommend psychiatric Inpatient admission when medically  cleared.  Ethelene Hal, NP 11/21/2017 1:27 PM   Patient seen face-to-face for psychiatric evaluation, chart reviewed and case discussed with the physician extender and developed treatment plan. Reviewed the information documented and agree with the treatment plan.  Buford Dresser, DO 11/21/17 6:35 PM

## 2017-11-21 NOTE — ED Notes (Addendum)
Pt dressed in scrubs and belongings labeled; placed at nurses station.

## 2017-11-22 DIAGNOSIS — F29 Unspecified psychosis not due to a substance or known physiological condition: Secondary | ICD-10-CM

## 2017-11-22 LAB — PATHOLOGIST SMEAR REVIEW

## 2017-11-22 MED ORDER — LORAZEPAM 1 MG PO TABS: 1.0000 mg | ORAL_TABLET | ORAL | Status: DC | PRN

## 2017-11-22 MED ORDER — FLUOXETINE HCL 20 MG PO CAPS
20.0000 mg | ORAL_CAPSULE | Freq: Every day | ORAL | Status: DC
  Administered 2017-11-24: 20 mg via ORAL
  Filled 2017-11-22 (×7): qty 1

## 2017-11-22 MED ORDER — RISPERIDONE 2 MG PO TABS
2.0000 mg | ORAL_TABLET | Freq: Two times a day (BID) | ORAL | Status: DC
  Administered 2017-11-23 – 2017-11-24 (×2): 2 mg via ORAL
  Filled 2017-11-22 (×11): qty 1

## 2017-11-22 MED ORDER — TEMAZEPAM 15 MG PO CAPS
30.0000 mg | ORAL_CAPSULE | Freq: Every day | ORAL | Status: DC
  Filled 2017-11-22 (×2): qty 2

## 2017-11-22 MED ORDER — BENZTROPINE MESYLATE 0.5 MG PO TABS
0.5000 mg | ORAL_TABLET | Freq: Two times a day (BID) | ORAL | Status: DC
  Administered 2017-11-23 – 2017-11-24 (×2): 0.5 mg via ORAL
  Filled 2017-11-22 (×11): qty 1

## 2017-11-22 NOTE — Tx Team (Signed)
Interdisciplinary Treatment and Diagnostic Plan Update  11/22/2017 Time of Session: 0830AM Cameron May MRN: [id number]  Principal Diagnosis: Schizoaffective d/o bipolar depressed w psychosis  Secondary Diagnoses: Active Problems:   Acute psychosis (HCC)   Current Medications:  Current Facility-Administered Medications  Medication Dose Route Frequency Provider Last Rate Last Dose  . acetaminophen (TYLENOL) tablet 650 mg  650 mg Oral Q6H PRN Laveda Abbe, NP      . alum & mag hydroxide-simeth (MAALOX/MYLANTA) 200-200-20 MG/5ML suspension 30 mL  30 mL Oral Q4H PRN Laveda Abbe, NP      . benztropine (COGENTIN) tablet 0.5 mg  0.5 mg Oral BID Malvin Johns, MD   Stopped at 11/22/17 [phone]  . FLUoxetine (PROZAC) capsule 20 mg  20 mg Oral Daily Malvin Johns, MD   Stopped at 11/22/17 [phone]  . hydrOXYzine (ATARAX/VISTARIL) tablet 25 mg  25 mg Oral TID PRN Laveda Abbe, NP      . LORazepam (ATIVAN) tablet 1 mg  1 mg Oral Q4H PRN Malvin Johns, MD      . magnesium hydroxide (MILK OF MAGNESIA) suspension 30 mL  30 mL Oral Daily PRN Laveda Abbe, NP      . risperiDONE (RISPERDAL) tablet 2 mg  2 mg Oral BID Malvin Johns, MD   Stopped at 11/22/17 [phone]  . temazepam (RESTORIL) capsule 30 mg  30 mg Oral QHS Malvin Johns, MD      . traZODone (DESYREL) tablet 50 mg  50 mg Oral QHS Laveda Abbe, NP       PTA Medications: No medications prior to admission.    Treatment Modalities: Medication Management, Group therapy, Case management,  1 to 1 session with clinician, Psychoeducation, Recreational therapy.   Physician Treatment Plan for Primary Diagnosis:  Schizoaffective d/o bipolar depressed w psychosis Long Term Goal(s): Improvement in symptoms so as ready for discharge Improvement in symptoms so as ready for discharge   Short Term Goals: Ability to identify changes in lifestyle to reduce recurrence of condition will improve Ability to identify  changes in lifestyle to reduce recurrence of condition will improve  Medication Management: Evaluate patient's response, side effects, and tolerance of medication regimen.  Therapeutic Interventions: 1 to 1 sessions, Unit Group sessions and Medication administration.  Evaluation of Outcomes: Progressing  Physician Treatment Plan for Secondary Diagnosis: Active Problems:   Acute psychosis (HCC)  Long Term Goal(s): Improvement in symptoms so as ready for discharge Improvement in symptoms so as ready for discharge   Short Term Goals: Ability to identify changes in lifestyle to reduce recurrence of condition will improve Ability to identify changes in lifestyle to reduce recurrence of condition will improve     Medication Management: Evaluate patient's response, side effects, and tolerance of medication regimen.  Therapeutic Interventions: 1 to 1 sessions, Unit Group sessions and Medication administration.  Evaluation of Outcomes: Progressing   RN Treatment Plan for Primary Diagnosis:  Schizoaffective d/o bipolar depressed w psychosis Long Term Goal(s): Knowledge of disease and therapeutic regimen to maintain health will improve  Short Term Goals: Ability to remain free from injury will improve, Ability to verbalize frustration and anger appropriately will improve, Ability to demonstrate self-control and Ability to participate in decision making will improve  Medication Management: RN will administer medications as ordered by provider, will assess and evaluate patient's response and provide education to patient for prescribed medication. RN will report any adverse and/or side effects to prescribing provider.  Therapeutic Interventions: 1 on 1 counseling  sessions, Psychoeducation, Medication administration, Evaluate responses to treatment, Monitor vital signs and CBGs as ordered, Perform/monitor CIWA, COWS, AIMS and Fall Risk screenings as ordered, Perform wound care treatments as  ordered.  Evaluation of Outcomes: Progressing   LCSW Treatment Plan for Primary Diagnosis:  Schizoaffective d/o bipolar depressed w psychosis Long Term Goal(s): Safe transition to appropriate next level of care at discharge, Engage patient in therapeutic group addressing interpersonal concerns.  Short Term Goals: Engage patient in aftercare planning with referrals and resources, Increase emotional regulation, Facilitate patient progression through stages of change regarding substance use diagnoses and concerns and Identify triggers associated with mental health/substance abuse issues  Therapeutic Interventions: Assess for all discharge needs, 1 to 1 time with Social worker, Explore available resources and support systems, Assess for adequacy in community support network, Educate family and significant other(s) on suicide prevention, Complete Psychosocial Assessment, Interpersonal group therapy.  Evaluation of Outcomes: Progressing   Progress in Treatment: Attending groups: Yes. Participating in groups: Yes. Taking medication as prescribed: Yes. Toleration medication: Yes. Family/Significant other contact made: No, will contact:  family member for collateral information and to complete SPE with pt consent Patient understands diagnosis: Yes. Discussing patient identified problems/goals with staff: Yes. Medical problems stabilized or resolved: Yes. Denies suicidal/homicidal ideation: Yes. Issues/concerns per patient self-inventory: No. Other: n/a   New problem(s) identified: No, Describe:  n/a  New Short Term/Long Term Goal(s):  Patient Goals:    Discharge Plan or Barriers:   Reason for Continuation of Hospitalization: Anxiety Depression Hallucinations Medication stabilization Other; describe paranoia  Estimated Length of Stay: Wed, 11/27/17  Attendees: Patient: 11/22/2017 3:16 PM  Physician: Dr. Jeannine Kitten MD 11/22/2017 3:16 PM  Nursing: Meriam Sprague RN; Casimiro Needle RN 11/22/2017 3:16 PM   RN Care Manager:x 11/22/2017 3:16 PM  Social Worker: Corrie Mckusick LCSW 11/22/2017 3:16 PM  Recreational Therapist: x 11/22/2017 3:16 PM  Other: Armandina Stammer NP 11/22/2017 3:16 PM  Other:  11/22/2017 3:16 PM  Other: 11/22/2017 3:16 PM    Scribe for Treatment Team: Rona Ravens, LCSW 11/22/2017 3:16 PM
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
## 2017-11-22 NOTE — Progress Notes (Signed)
Recreation Therapy Notes  Date: 11.8.19 Time: 1000 Location: 500 Hall Dayroom  Group Topic:  Music Therapy  Goal Area(s) Addresses:  Patient will identify ways in which music is beneficial. Patient will identify different emotions music can bring out.  Intervention: Music  Activity: Music Therapy.  LRT played music for patients to relax and enjoy some soothing tunes.  Patients could make requests of songs they wanted to hear.  Education: Communication, Discharge Planning  Education Outcome: Acknowledges understanding/In group clarification offered/Needs additional education.   Clinical Observations/Feedback: Pt did not attend group.    Caroll Rancher, LRT/CTRS        Caroll Rancher A 11/22/2017 12:11 PM

## 2017-11-22 NOTE — Progress Notes (Signed)
Recreation Therapy Notes  INPATIENT RECREATION THERAPY ASSESSMENT  Patient Details Name: Cameron May MRN: [id number] DOB: 06-Dec-1995 Today's Date: 11/22/2017       Information Obtained From: Patient  Able to Participate in Assessment/Interview: Yes  Patient Presentation: Alert  Reason for Admission (Per Patient): Suicidal Ideation  Patient Stressors: Other (Comment)(Pt stated he felt he was losing himself)  Coping Skills:   Isolation, Journal, Sports, TV, Arguments, Music, Exercise, Meditate, Deep Breathing, Substance Abuse, Talk, Prayer, Read, Hot Bath/Shower  Leisure Interests (2+):  Individual - Other (Comment)(Chill; Think)  Frequency of Recreation/Participation: Other (Comment)(Daily)  Awareness of Community Resources:  Yes  Community Resources:  Library, Research scientist (physical sciences), Coffee Shop  Current Use: Yes  If no, Barriers?:    Expressed Interest in [employer] Information: No  [employer] of Residence:  [place]  Patient Main Form of Transportation: Car(Pt also stated he walks and takes the bus)  Patient Strengths:  Pretty Fast  Patient Identified Areas of Improvement:  Communication  Patient Goal for Hospitalization:  "Get my mind right before I leave"  Current SI (including self-harm):  No  Current HI:  No  Current AVH: No  Staff Intervention Plan: Group Attendance, Collaborate with Interdisciplinary Treatment Team  Consent to Intern Participation: N/A    Caroll Rancher, LRT/CTRS  Lillia Abed, Zariyah Stephens A 11/22/2017, 2:15 PM
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
## 2017-11-23 DIAGNOSIS — F419 Anxiety disorder, unspecified: Secondary | ICD-10-CM

## 2017-11-23 DIAGNOSIS — G4700 Insomnia, unspecified: Secondary | ICD-10-CM

## 2017-11-23 DIAGNOSIS — F23 Brief psychotic disorder: Principal | ICD-10-CM
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
## 2017-11-23 NOTE — [facility] Group Notes (Signed)
Group Therapy: Chaplain-[facility] Group  Participation Level:  Active  Participation Quality:  Attentive  Affect:  Flat  Cognitive:  Oriented  Insight:  Limited  Engagement in Therapy:  Limited  Modes of Intervention:  Discussion, Socialization  Summary of Progress/Problems:  Chaplain was here to lead a group on themes of hope and courage. Cameron May attended the entire session. He was quiet with limited responses. Cameron May stated that "I am not a quitter" when responding to his idea of hope.   Marian Sorrow MSW Intern 11/23/2017 2:10 PM

## 2017-11-23 NOTE — Plan of Care (Signed)
  Problem: Activity: Goal: Interest or engagement in activities will improve Outcome: Not Progressing   Problem: Safety: Goal: Periods of time without injury will increase Outcome: Progressing  DAR NOTE: Patient presents with irritable affect and depressed mood.  Denies suicidal thoughts, pain, auditory and visual hallucinations.  Rates depression at 0, hopelessness at 0, and anxiety at 0.  Maintained on routine safety checks.  Refused all prescribed medications after several attempts and encouragements.  Patient remained withdrawn and isolates to his room for majority of this shift.  Offered no complaint.

## 2017-11-23 NOTE — Progress Notes (Signed)
D: Pt denies SI/HI/AVH. Pt is pleasant and cooperative , but pt has been guarded on the unit this evening. Pt avoidant of writer due to not wanting to hear some issues he needs to address to help his life improve. Pt stated he was "ok" . Pt refused sleep medications, pt stated he would be able to sleep. Pt may benefit from possible mentor program  A: Pt was offered support and encouragement. . Pt was encourage to attend groups. Q 15 minute checks were done for safety.  R: safety maintained on unit.

## 2017-11-23 NOTE — [facility] Group Notes (Signed)
[facility] Group Notes: (Clinical Social Work)   11/23/2017      Type of Therapy:  Group Therapy   Participation Level:  Did Not Attend despite MHT prompting   Ambrose Mantle, LCSW 11/23/2017, 12:11 PM
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
## 2017-11-24 NOTE — [facility] Group Notes (Signed)
[facility] LCSW Group Therapy Note  Date/Time:  11/24/2017  11:00AM-12:00PM  Type of Therapy and Topic:  Group Therapy:  Music and Mood  Participation Level:  Active   Description of Group: In this process group, members listened to a variety of genres of music and identified that different types of music evoke different responses.  Patients were encouraged to identify music that was soothing for them and music that was energizing for them.  Patients discussed how this knowledge can help with wellness and recovery in various ways including managing depression and anxiety as well as encouraging healthy sleep habits.    Therapeutic Goals: 1. Patients will explore the impact of different varieties of music on mood 2. Patients will verbalize the thoughts they have when listening to different types of music 3. Patients will identify music that is soothing to them as well as music that is energizing to them 4. Patients will discuss how to use this knowledge to assist in maintaining wellness and recovery 5. Patients will explore the use of music as a coping skill  Summary of Patient Progress:  At the beginning of group, patient expressed that he felt good, and at the end of group said he felt "betrayed."  He could not explain this feeling or whether it related to any of the music played or whether it was about something outside of the group setting.  He had smiled at several songs, and never asked any songs to be stopped.  Therapeutic Modalities: Solution Focused Brief Therapy Activity   Ambrose Mantle, LCSW

## 2017-11-24 NOTE — Progress Notes (Signed)
D:  Cameron May was up and visible on the unit.  He interacted well with peers in the day room.  He was playful during the early evening.  He had to be redirected due to throwing the stress ball against the wall, he continued to throw the ball even after staff asked him to stop.  Staff eventually had to take the stress ball away.  He was smiling at staff while this was happening.  He did deny suicidal ideation because "I don't want to do that."  He denied A/V or HI.  He is very minimal with his answers.  He declined to take sleeping medications tonight stating, "I sleep fine."  He is currently resting with his eyes closed and appears to be asleep. A:  1:1 with RN for support and encouragement.  Medications as ordered.  Q 15 minute checks maintained for safety.  Encouraged participation in group and unit activities.   R: Cameron May remains safe on the unit.  We will continue to monitor the progress towards his goals.

## 2017-11-24 NOTE — Progress Notes (Signed)
Patient ID: Cameron May, male   DOB: 09/05/1995, 22 y.o.   MRN: [id number]    Pts mother has been approved to visit during the day, when there are no groups going on per [facility] NP. Jacquelyne Balint RN

## 2017-11-24 NOTE — Plan of Care (Signed)
  Problem: Role Relationship: Goal: Ability to interact with others will improve Outcome: Not Progressing Note:  Cameron May has chosen to stay in his room much of the day.  Minimal interaction with staff or peers.

## 2017-11-24 NOTE — Progress Notes (Signed)
Adult Psychoeducational Group Note  Date:  11/24/2017 Time:  11:46 PM  Group Topic/Focus:  Wrap-Up Group:   The focus of this group is to help patients review their daily goal of treatment and discuss progress on daily workbooks.  Participation Level:  Did Not Attend  Participation Quality:  Did not attend  Affect:  Did not attend  Cognitive:  Did not attend  Insight: None  Engagement in Group:  Did not attend  Modes of Intervention:  Did not attend  Additional Comments:  Pt did not attend evening wrap up group tonight.  Felipa Furnace 11/24/2017, 11:46 PM

## 2017-11-24 NOTE — Progress Notes (Signed)
Patient's mother Mrs. Boettner phone [phone], called and very upset because her son called her and stated he had to take his medicine or that he would not be able to go home.  This nurse did not say that he had to take medicine and that was the only way to go home.   But patient did willingly take his medicine this morning.  Patient's mother wants him to go to counseling, not take meds.  Patient needs to get to the root of the problem.  Boy is worse than when he came in, wants to sign himself out.  Does not feel safe here.  Boy is in the [employer].  Boy needs to be recommended something else, not to just take medicine.  AC informed and call transferred to [facility].  This nurse did not tell patient that he had to take his medicine or that he would not be able to go home. AC informed that this patient was not told that by this nurse.

## 2017-11-24 NOTE — Progress Notes (Signed)
D: Cameron May has remained in his room all evening.  He did not attend evening wrap up group.  He did visit with his mother and she requested that RN put note in chart to have SW and MD call her tomorrow.  He denied SI/HI or A/V hallucinations.  He declined needing sleeping medications this evening.  He denied any pain or discomfort and appeared to be in no physical distress.  He is currently resting with his eyes closed and appears to be asleep. A:  1:1 with RN for support and encouragement.  Q 15 minute checks maintained for safety.  Encouraged participation in group and unit activities.   R:  Cameron May remains safe on the unit.  We will continue to monitor the progress towards his goals.

## 2017-11-24 NOTE — Progress Notes (Signed)
Patient ID: Cameron May, male   DOB: March 28, 1995, 22 y.o.   MRN: [id number]     D: Pt has been very paranoid and suspicious today on the unit. Pt did not attend any morning groups and did not engage in treatment. Made several attempts to give pt his medication, pt refused to take medication. Pt reported that he was not taking anymore medication. After about two hours pt went up to medication window requesting his medication, he reported that he changed his mind. Pt refused to fill out his pt self inventory sheet, pt reported that he was cool. Pt reported being negative SI/HI, no AH/VH noted. A: 15 min checks continued for patient safety. R: Pt safety maintained.

## 2017-11-24 NOTE — Plan of Care (Signed)
  Problem: Activity: Goal: Sleeping patterns will improve Outcome: Progressing Note:  Pt declined taking sleeping pills stating he sleeps fine without them

## 2017-11-25 ENCOUNTER — Other Ambulatory Visit: Payer: Self-pay

## 2017-11-25 MED ORDER — BENZTROPINE MESYLATE 0.5 MG PO TABS: 0.5000 mg | ORAL_TABLET | Freq: Two times a day (BID) | ORAL | 0 refills | Status: AC

## 2017-11-25 MED ORDER — RISPERIDONE 2 MG PO TABS: 2.0000 mg | ORAL_TABLET | Freq: Two times a day (BID) | ORAL | 0 refills | Status: AC

## 2017-11-25 MED ORDER — HYDROXYZINE HCL 25 MG PO TABS: 25.0000 mg | ORAL_TABLET | Freq: Three times a day (TID) | ORAL | 0 refills | Status: AC | PRN

## 2017-11-25 MED ORDER — FLUOXETINE HCL 20 MG PO CAPS: 20.0000 mg | ORAL_CAPSULE | Freq: Every day | ORAL | 0 refills | Status: AC

## 2017-11-25 MED ORDER — TRAZODONE HCL 50 MG PO TABS: 50.0000 mg | ORAL_TABLET | Freq: Every day | ORAL | 0 refills | Status: AC

## 2017-11-25 NOTE — Plan of Care (Signed)
Pt did not attend recreation therapy group sessions.   Bathsheba Durrett, LRT/CTRS 

## 2017-11-25 NOTE — BHH Suicide Risk Assessment (Signed)
[facility] Discharge Suicide Risk Assessment   Principal Problem: Acute psychosis [facility]) Discharge Diagnoses: stable Patient Active Problem List   Diagnosis Date Noted  . Psychosis (HCC) [F29] 11/21/2017  . Acute psychosis (HCC) [F23] 11/21/2017  . 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 Hemorrhoid [K64.9] 04/21/2012  . Allergic rhinitis [477] 04/30/2011  . ADHD (attention deficit hyperactivity disorder) [F90.9] 04/30/2011    Total Time spent with patient: 30 minutes  Musculoskeletal: Strength & Muscle Tone: within normal limits Gait & Station: normal Patient leans: N/A  Psychiatric Specialty Exam: ROS  Blood pressure 126/84, pulse (!) 53, temperature 97.6 F (36.4 C), temperature source Oral, resp. rate 18, height 5\' 10"  (1.778 m), weight 72.6 kg, SpO2 100 %.Body mass index is 22.96 kg/m.  General Appearance: Well Groomed  Patent attorney::  Good  Speech:  Clear and Coherent409  Volume:  Normal  Mood:  stable  Affect:  Congruent  Thought Process:  Goal Directed  Orientation:  Full (Time, Place, and Person)  Thought Content:  Logical  Suicidal Thoughts:  No  Homicidal Thoughts:  No  Memory:  Immediate;   Good  Judgement:  Good  Insight:  Good  Psychomotor Activity:  Normal  Concentration:  Good  Recall:  Good  Fund of Knowledge:Good  Language: Good  Akathisia:  Negative  Handed:  Right  AIMS (if indicated):     Assets:  Communication Skills  Sleep:  Number of Hours: 6  Cognition: WNL  ADL's:  Intact   Mental Status Per Nursing Assessment::   On Admission:  NA  Demographic Factors:  Male  Loss Factors: n/a  Historical Factors: NA  Risk Reduction Factors:   NA  Continued Clinical Symptoms:  Schizophrenia:   Depressive state  Cognitive Features That Contribute To Risk:  None    Suicide Risk:  Minimal: No identifiable suicidal ideation.  Patients presenting with no risk factors but with morbid ruminations; may be classified as minimal risk  based on the severity of the depressive symptoms    Plan Of Care/Follow-up recommendations:  Activity:  full  Suman Trivedi, MD 11/25/2017, 8:47 AM

## 2017-11-25 NOTE — Progress Notes (Signed)
Recreation Therapy Notes  Date: 11.11.19 Time: 1000 Location: 500 Hall Dayroom  Group Topic: Communication, Team Building, Problem Solving  Goal Area(s) Addresses:  Patient will effectively work with peer towards shared goal.  Patient will identify skill used to make activity successful.  Patient will identify how skills used during activity can be used to reach post d/c goals.   Intervention: STEM Activity   Activity: Pipe Cleaner Tower. In teams, patients were asked to build the tallest freestanding tower possible out of 15 pipe cleaners. Systematically resources were removed, for example patient ability to use both hands and patient ability to verbally communicate.    Education: Social Skills, Discharge Planning.   Education Outcome: Acknowledges education/In group clarification offered/Needs additional education.   Clinical Observations/Feedback: Pt did not attend group.     Jarah Pember, LRT/CTRS         Kevaughn Ewing A 11/25/2017 11:23 AM 

## 2017-11-25 NOTE — Progress Notes (Signed)
  [facility] Adult Case Management Discharge Plan :  Will you be returning to the same living situation after discharge:  Yes,  with parent At discharge, do you have transportation home?: Yes,  mother Do you have the ability to pay for your medications: Yes,  [employer]  Release of information consent forms completed and in the chart;  Patient's signature needed at discharge.  Patient to Follow up at: Follow-up Information    [facility]. Go on 12/02/2017.   Why:  Please attend your medication appt with Dr Jackquline Berlin on Monday, 12/02/17, ay 12:45pm. Contact information: [address] [place], [place] [address] P: [phone] F: [phone]       [facility]. Go on 11/26/2017.   Why:  Please attend your therapy appt on Tuesday, 11/26/17. Contact information: [address] [place], [place] [address] P: [phone] F: not available          Next level of care provider has access to [facility] Link:no  Safety Planning and Suicide Prevention discussed: Yes,  with mother  Have you used any form of tobacco in the last 30 days? (Cigarettes, Smokeless Tobacco, Cigars, and/or Pipes): No  Has patient been referred to the Quitline?: N/A patient is not a smoker  Patient has been referred for addiction treatment: Pt. refused referral  Cameron Frederick, LCSW 11/25/2017, 11:57 AM
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
## 2017-11-25 NOTE — Progress Notes (Signed)
Cameron May's mother, Cameron May, called to talk with RN about how she can best support her son with his mental health issues.  Encouraged her to continue to allow him to to make choices on his own and encourage him to continue with counseling.  Explained that medications can be helpful to be able to make his thoughts clearer so the counseling can be more effective.  Also encouraged her to seek out some support groups for families that are dealing with mental health issues in their loved ones.  Provided her with information (phone number and address) about Mental Health [facility] and the support group that meets Tuesdays 7:00pm-8:30pm.  She also reported that Cameron May had an appointment this past Friday with [facility], [phone].

## 2017-11-25 NOTE — Tx Team (Signed)
Initial Treatment Plan 11/25/2017 6:24 AM Rush Farmer ZOX:[id number]    PATIENT STRESSORS: Educational concerns Substance abuse Traumatic event   PATIENT STRENGTHS: Communication skills General fund of knowledge Physical Health Supportive family/friends   PATIENT IDENTIFIED PROBLEMS: "To go home"                     DISCHARGE CRITERIA:  Improved stabilization in mood, thinking, and/or behavior Verbal commitment to aftercare and medication compliance  PRELIMINARY DISCHARGE PLAN: Outpatient therapy Medication management  PATIENT/FAMILY INVOLVEMENT: This treatment plan has been presented to and reviewed with the patient, Cameron May.  The patient and family have been given the opportunity to ask questions and make suggestions.  Levin Bacon, RN 11/25/2017, 6:24 AM

## 2017-11-25 NOTE — Progress Notes (Signed)
Recreation Therapy Notes  INPATIENT RECREATION TR PLAN  Patient Details Name: Cameron May MRN: [id number] DOB: January 08, 1996 Today's Date: 11/25/2017  Rec Therapy Plan Is patient appropriate for Therapeutic Recreation?: Yes Treatment times per week: about 3 days Estimated Length of Stay: 5-7 days TR Treatment/Interventions: Group participation (Comment)  Discharge Criteria Pt will be discharged from therapy if:: Discharged Treatment plan/goals/alternatives discussed and agreed upon by:: Patient/family  Discharge Summary Short term goals set: See patient care plan Short term goals met: Not met Reason goals not met: Pt did not attend groups. Therapeutic equipment acquired: N/A Reason patient discharged from therapy: Discharge from hospital Pt/family agrees with progress & goals achieved: Yes Date patient discharged from therapy: 11/25/17   Victorino Sparrow, LRT/CTRS  Ria Comment, Jaquawn Saffran A 11/25/2017, 11:28 AM

## 2017-11-26 NOTE — Progress Notes (Signed)
Patient ID: Cameron FarmerKenneth Willie May, male   DOB: November 17, 1995, 22 y.o.   MRN: [id number]  Patient discharged to home/self care in the presence of family.  Patient denies SI, HI and AVH.  Patient acknowledges all discharge instructions and receipt of personal belongings.

## 2017-11-26 NOTE — Progress Notes (Signed)
CSW received email from Thurman Coyer about [employer] insurance problem: pt DOB is incorrect with [facility] and needs to be changed.  CSW was able to reach pt mother, Elmon Else, and shared this information and she will have pt call [facility] to correct. Garner Nash, MSW, LCSW Clinical Social Worker 11/26/2017 9:00 AM
